# Patient Record
Sex: Female | Born: 1955 | Race: White | Hispanic: No | Marital: Married | Smoking: Current every day smoker
Health system: Southern US, Community
[De-identification: ages and names within clinical notes are randomized; demographics above are authoritative.]

## PROBLEM LIST (undated history)

## (undated) DIAGNOSIS — R519 Headache, unspecified: Secondary | ICD-10-CM

## (undated) DIAGNOSIS — F329 Major depressive disorder, single episode, unspecified: Secondary | ICD-10-CM

## (undated) DIAGNOSIS — K219 Gastro-esophageal reflux disease without esophagitis: Secondary | ICD-10-CM

## (undated) DIAGNOSIS — F32A Depression, unspecified: Secondary | ICD-10-CM

## (undated) DIAGNOSIS — K589 Irritable bowel syndrome without diarrhea: Secondary | ICD-10-CM

## (undated) DIAGNOSIS — R609 Edema, unspecified: Secondary | ICD-10-CM

## (undated) DIAGNOSIS — F419 Anxiety disorder, unspecified: Secondary | ICD-10-CM

## (undated) DIAGNOSIS — I10 Essential (primary) hypertension: Secondary | ICD-10-CM

## (undated) DIAGNOSIS — R51 Headache: Secondary | ICD-10-CM

## (undated) HISTORY — DX: Headache, unspecified: R51.9

## (undated) HISTORY — PX: BRAIN SURGERY: SHX531

## (undated) HISTORY — DX: Major depressive disorder, single episode, unspecified: F32.9

## (undated) HISTORY — PX: TONSILLECTOMY: SUR1361

## (undated) HISTORY — DX: Irritable bowel syndrome without diarrhea: K58.9

## (undated) HISTORY — DX: Depression, unspecified: F32.A

## (undated) HISTORY — DX: Anxiety disorder, unspecified: F41.9

## (undated) HISTORY — DX: Gastro-esophageal reflux disease without esophagitis: K21.9

## (undated) HISTORY — DX: Irritable bowel syndrome, unspecified: K58.9

## (undated) HISTORY — PX: GALLBLADDER SURGERY: SHX652

## (undated) HISTORY — DX: Headache: R51

## (undated) HISTORY — DX: Edema, unspecified: R60.9

---

## 1998-06-15 HISTORY — PX: OTHER SURGICAL HISTORY: SHX169

## 2002-06-15 HISTORY — PX: ARTERIAL ANEURYSM REPAIR: SHX556

## 2004-02-29 ENCOUNTER — Other Ambulatory Visit: Payer: Self-pay

## 2004-04-23 ENCOUNTER — Emergency Department: Payer: Self-pay | Admitting: Internal Medicine

## 2004-04-23 ENCOUNTER — Other Ambulatory Visit: Payer: Self-pay

## 2014-11-27 ENCOUNTER — Ambulatory Visit: Payer: Self-pay | Admitting: Family Medicine

## 2014-12-03 ENCOUNTER — Encounter: Payer: Self-pay | Admitting: Family Medicine

## 2014-12-03 ENCOUNTER — Ambulatory Visit (INDEPENDENT_AMBULATORY_CARE_PROVIDER_SITE_OTHER): Payer: Self-pay | Admitting: Family Medicine

## 2014-12-03 VITALS — BP 142/84 | HR 112 | Temp 98.5°F | Resp 16 | Ht 60.0 in | Wt 159.7 lb

## 2014-12-03 DIAGNOSIS — G44219 Episodic tension-type headache, not intractable: Secondary | ICD-10-CM

## 2014-12-03 DIAGNOSIS — R519 Headache, unspecified: Secondary | ICD-10-CM | POA: Insufficient documentation

## 2014-12-03 DIAGNOSIS — K589 Irritable bowel syndrome without diarrhea: Secondary | ICD-10-CM

## 2014-12-03 DIAGNOSIS — F411 Generalized anxiety disorder: Secondary | ICD-10-CM | POA: Insufficient documentation

## 2014-12-03 DIAGNOSIS — R51 Headache: Secondary | ICD-10-CM

## 2014-12-03 DIAGNOSIS — K2100 Gastro-esophageal reflux disease with esophagitis, without bleeding: Secondary | ICD-10-CM | POA: Insufficient documentation

## 2014-12-03 DIAGNOSIS — J449 Chronic obstructive pulmonary disease, unspecified: Secondary | ICD-10-CM

## 2014-12-03 DIAGNOSIS — G4700 Insomnia, unspecified: Secondary | ICD-10-CM | POA: Insufficient documentation

## 2014-12-03 DIAGNOSIS — D510 Vitamin B12 deficiency anemia due to intrinsic factor deficiency: Secondary | ICD-10-CM | POA: Insufficient documentation

## 2014-12-03 DIAGNOSIS — F32A Depression, unspecified: Secondary | ICD-10-CM

## 2014-12-03 DIAGNOSIS — R609 Edema, unspecified: Secondary | ICD-10-CM | POA: Insufficient documentation

## 2014-12-03 DIAGNOSIS — F329 Major depressive disorder, single episode, unspecified: Secondary | ICD-10-CM

## 2014-12-03 DIAGNOSIS — R300 Dysuria: Secondary | ICD-10-CM

## 2014-12-03 DIAGNOSIS — F17200 Nicotine dependence, unspecified, uncomplicated: Secondary | ICD-10-CM | POA: Insufficient documentation

## 2014-12-03 DIAGNOSIS — F1920 Other psychoactive substance dependence, uncomplicated: Secondary | ICD-10-CM | POA: Insufficient documentation

## 2014-12-03 DIAGNOSIS — R569 Unspecified convulsions: Secondary | ICD-10-CM

## 2014-12-03 LAB — POCT URINALYSIS DIPSTICK
Bilirubin, UA: NEGATIVE
Blood, UA: NEGATIVE
GLUCOSE UA: NEGATIVE
Leukocytes, UA: NEGATIVE
NITRITE UA: NEGATIVE
Protein, UA: NEGATIVE
Spec Grav, UA: 1.01
Urobilinogen, UA: NEGATIVE
pH, UA: 5

## 2014-12-03 MED ORDER — TIOTROPIUM BROMIDE MONOHYDRATE 18 MCG IN CAPS: 18.0000 ug | ORAL_CAPSULE | RESPIRATORY_TRACT | Status: DC

## 2014-12-03 MED ORDER — DIPHENOXYLATE-ATROPINE 2.5-0.025 MG PO TABS: 2.0000 | ORAL_TABLET | Freq: Four times a day (QID) | ORAL | Status: DC | PRN

## 2014-12-03 MED ORDER — DIPHENOXYLATE-ATROPINE 2.5-0.025 MG PO TABS: 1.0000 | ORAL_TABLET | Freq: Four times a day (QID) | ORAL | Status: DC | PRN
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
## 2014-12-03 NOTE — Patient Instructions (Signed)
FU in 3 mo

## 2015-01-08 ENCOUNTER — Encounter: Payer: Self-pay | Admitting: Family Medicine

## 2015-01-31 ENCOUNTER — Encounter: Payer: Self-pay | Admitting: Family Medicine

## 2015-02-27 ENCOUNTER — Other Ambulatory Visit: Payer: Self-pay | Admitting: Family Medicine

## 2015-02-27 NOTE — Telephone Encounter (Signed)
I am forwarding this encounter to the designated PCP and/or their nursing staff for further management of the tasks requested. Thank you.  

## 2015-02-28 ENCOUNTER — Encounter: Payer: Self-pay | Admitting: Family Medicine

## 2015-03-14 NOTE — Telephone Encounter (Signed)
Spoke to pharmacy. Patient must be seen

## 2015-05-30 ENCOUNTER — Encounter: Payer: Self-pay | Admitting: Family Medicine

## 2015-05-30 ENCOUNTER — Ambulatory Visit (INDEPENDENT_AMBULATORY_CARE_PROVIDER_SITE_OTHER): Payer: Self-pay | Admitting: Family Medicine

## 2015-05-30 VITALS — BP 134/78 | HR 110 | Temp 98.1°F | Resp 18 | Ht 60.0 in | Wt 167.4 lb

## 2015-05-30 DIAGNOSIS — R609 Edema, unspecified: Secondary | ICD-10-CM

## 2015-05-30 DIAGNOSIS — F329 Major depressive disorder, single episode, unspecified: Secondary | ICD-10-CM

## 2015-05-30 DIAGNOSIS — N3000 Acute cystitis without hematuria: Secondary | ICD-10-CM

## 2015-05-30 DIAGNOSIS — K2100 Gastro-esophageal reflux disease with esophagitis, without bleeding: Secondary | ICD-10-CM

## 2015-05-30 DIAGNOSIS — F331 Major depressive disorder, recurrent, moderate: Secondary | ICD-10-CM

## 2015-05-30 DIAGNOSIS — J449 Chronic obstructive pulmonary disease, unspecified: Secondary | ICD-10-CM

## 2015-05-30 DIAGNOSIS — K589 Irritable bowel syndrome without diarrhea: Secondary | ICD-10-CM

## 2015-05-30 DIAGNOSIS — Z23 Encounter for immunization: Secondary | ICD-10-CM

## 2015-05-30 DIAGNOSIS — F17200 Nicotine dependence, unspecified, uncomplicated: Secondary | ICD-10-CM

## 2015-05-30 DIAGNOSIS — F32A Depression, unspecified: Secondary | ICD-10-CM

## 2015-05-30 DIAGNOSIS — E669 Obesity, unspecified: Secondary | ICD-10-CM

## 2015-05-30 LAB — POCT URINALYSIS DIPSTICK
Bilirubin, UA: NEGATIVE
Glucose, UA: NEGATIVE
Leukocytes, UA: NEGATIVE
NITRITE UA: NEGATIVE
PH UA: 6
PROTEIN UA: NEGATIVE
RBC UA: NEGATIVE
SPEC GRAV UA: 1.015
UROBILINOGEN UA: NEGATIVE

## 2015-05-30 MED ORDER — TIOTROPIUM BROMIDE MONOHYDRATE 18 MCG IN CAPS: 18.0000 ug | ORAL_CAPSULE | Freq: Four times a day (QID) | RESPIRATORY_TRACT | Status: DC

## 2015-05-30 MED ORDER — CIPROFLOXACIN HCL 500 MG PO TABS: 500.0000 mg | ORAL_TABLET | Freq: Two times a day (BID) | ORAL | Status: DC

## 2015-05-30 MED ORDER — DIPHENOXYLATE-ATROPINE 2.5-0.025 MG PO TABS: 2.0000 | ORAL_TABLET | Freq: Four times a day (QID) | ORAL | Status: DC | PRN

## 2015-05-30 NOTE — Progress Notes (Signed)
Name: Megan Chung   MRN: [id number]    DOB: 08/24/1955   Date:05/30/2015       Progress Note  Subjective  Chief Complaint  Chief Complaint  Patient presents with  . Urinary Tract Infection  . Depression  . Insomnia    HPI  IBS with diarrhea predominance  Patient has a history of IBS for greater than 5 years. She takes Lomotil 4 times per day and this brings about enough controlled to make things functional for her. She has not had a colonoscopy not having insurance at this point but denies any blood in her stool time. There is been no recent weight loss in fact a 7 pound weight gain since last visit here.  Depression  Long-standing history of depression for which she currently patient is currently on Zoloft 100 mg daily. She has some decrease in mood at times but does have an endocrine spells suicidal ideations. Appetite is good and there is no significant sleep disturbance.  Hypertension   Patient presents for follow-up of hypertension. It has been present for over 5 years.  Patient states that there is compliance with medical regimen which consists of hydrochlorothiazide 25 mg daily . There is no end organ disease. Cardiac risk factors include hypertension hyperlipidemia and diabetes.  Exercise regimen consist of minimal walking .  Diet consist of salt restriction  COPD  History of COPD of for over 5 years. She continues to smoke despite warnings to the contrary. She is currently on Spiriva one elevation daily with when necessary rescue inhaler. There is no problem with any hemoptysis or purulent sputum production .  Past Medical History  Diagnosis Date  . Anxiety   . Headache   . IBS (irritable bowel syndrome)   . Depression   . Irritable bowel syndrome   . GERD (gastroesophageal reflux disease)   . Edema     Social History  Substance Use Topics  . Smoking status: Current Every Day Smoker -- 1.00 packs/day for 20 years    Types: Cigarettes  . Smokeless tobacco:  Never Used  . Alcohol Use: Not on file     Current outpatient prescriptions:  .  diphenoxylate-atropine (LOMOTIL) 2.5-0.025 MG per tablet, Take 2 tablets by mouth 4 (four) times daily as needed for diarrhea or loose stools., Disp: 240 tablet, Rfl: 2 .  hydrochlorothiazide (HYDRODIURIL) 25 MG tablet, Take by mouth., Disp: , Rfl:  .  omeprazole (PRILOSEC) 40 MG capsule, Take by mouth., Disp: , Rfl:  .  promethazine (PHENERGAN) 25 MG tablet, Take by mouth., Disp: , Rfl:  .  sertraline (ZOLOFT) 100 MG tablet, Take by mouth., Disp: , Rfl:  .  tiotropium (SPIRIVA HANDIHALER) 18 MCG inhalation capsule, Place 1 capsule (18 mcg total) into inhaler and inhale 1 day or 1 dose., Disp: 30 capsule, Rfl: 12 .  topiramate (TOPAMAX) 100 MG tablet, Take by mouth., Disp: , Rfl:  .  triamcinolone (KENALOG) 0.025 % cream, TRIAMCINOLONE ACETONIDE, 0.025% (External Cream)  apply to affected area Cream three times daily for 30 days  Quantity: 0;  Refills: 3   Ordered :18-Jul-2014  Dennison MascotMorrisey, [place] Droll MD;  Started 18-Jul-2014 Active, Disp: , Rfl:   Allergies  Allergen Reactions  . Penicillins Hives    Review of Systems  Constitutional: Positive for malaise/fatigue. Negative for fever, chills and weight loss.  HENT: Positive for congestion. Negative for hearing loss, sore throat and tinnitus.   Eyes: Negative for blurred vision, double vision and redness.  Respiratory: Positive  for cough and shortness of breath. Negative for hemoptysis and sputum production.   Cardiovascular: Negative for chest pain, palpitations, orthopnea, claudication and leg swelling.  Gastrointestinal: Positive for heartburn, nausea, abdominal pain and diarrhea. Negative for vomiting, constipation, blood in stool and melena.  Genitourinary: Negative for dysuria, urgency, frequency and hematuria.  Musculoskeletal: Negative for myalgias, back pain, joint pain, falls and neck pain.  Skin: Negative for itching.  Neurological: Positive for headaches.  Negative for dizziness, tingling, tremors, focal weakness, seizures, loss of consciousness and weakness.  Endo/Heme/Allergies: Does not bruise/bleed easily.  Psychiatric/Behavioral: Positive for depression. Negative for substance abuse. The patient is nervous/anxious. The patient does not have insomnia.      Objective  Filed Vitals:   05/30/15 1432  BP: 134/78  Pulse: 110  Temp: 98.1 F (36.7 C)  Resp: 18  Height: 5' (1.524 m)  Weight: 167 lb 6 oz (75.921 kg)  SpO2: 96%     Physical Exam  Constitutional: She is oriented to person, place, and time.  Obese  HENT:  Head: Normocephalic.  Eyes: EOM are normal. Pupils are equal, round, and reactive to light.  Neck: Normal range of motion. No thyromegaly present.  Cardiovascular: Normal rate, regular rhythm and normal heart sounds.   No murmur heard. Pulmonary/Chest: Effort normal.  Diminished breath sounds throughout with hyperresonance to percussion  Abdominal: Soft. Bowel sounds are normal. She exhibits no distension and no mass. There is tenderness (mild diffuse tenderness). There is no rebound and no guarding.  Musculoskeletal: Normal range of motion. She exhibits no edema.  Neurological: She is alert and oriented to person, place, and time. No cranial nerve deficit. Gait normal.  Skin: Skin is warm and dry. No rash noted.  Psychiatric: Memory normal.  Very anxious and loquacious as is her baseline      Assessment & Plan   1. IBS (irritable bowel syndrome) Severe stable - diphenoxylate-atropine (LOMOTIL) 2.5-0.025 MG tablet; Take 2 tablets by mouth 4 (four) times daily as needed for diarrhea or loose stools.  Dispense: 240 tablet; Refill: 2  2. COPD, moderate (HCC) Stable - tiotropium (SPIRIVA HANDIHALER) 18 MCG inhalation capsule; Place 1 capsule (18 mcg total) into inhaler and inhale 4 (four) times daily.  Dispense: 120 capsule; Refill: 2  3. Esophagitis, reflux Stable  4. Moderate episode of recurrent major  depressive disorder (HCC) Stable  5. Compulsive tobacco user syndrome Again encouraged discontinuation that she has reduced  6. Clinical depression Stable  7. Accumulation of fluid in tissues Resume diuretic  8. Obesity Encourage diet and exercise  9. Acute cystitis without hematuria Creeden. Liver Cipro - POCT Urinalysis Dipstick - ciprofloxacin (CIPRO) 500 MG tablet; Take 1 tablet (500 mg total) by mouth 2 (two) times daily.  Dispense: 14 tablet; Refill: 0  10. Need for influenza vaccination Given - Flu Vaccine QUAD 36+ mos PF IM (Fluarix & Fluzone Quad PF)

## 2015-07-26 ENCOUNTER — Other Ambulatory Visit: Payer: Self-pay | Admitting: Family Medicine

## 2015-08-21 ENCOUNTER — Ambulatory Visit (INDEPENDENT_AMBULATORY_CARE_PROVIDER_SITE_OTHER): Payer: Self-pay | Admitting: Family Medicine

## 2015-08-21 VITALS — BP 134/84 | HR 100 | Temp 98.0°F | Resp 14 | Ht 60.0 in | Wt 167.0 lb

## 2015-08-21 DIAGNOSIS — E538 Deficiency of other specified B group vitamins: Secondary | ICD-10-CM

## 2015-08-21 DIAGNOSIS — K589 Irritable bowel syndrome without diarrhea: Secondary | ICD-10-CM

## 2015-08-21 DIAGNOSIS — F32A Depression, unspecified: Secondary | ICD-10-CM

## 2015-08-21 DIAGNOSIS — K2100 Gastro-esophageal reflux disease with esophagitis, without bleeding: Secondary | ICD-10-CM

## 2015-08-21 DIAGNOSIS — R110 Nausea: Secondary | ICD-10-CM

## 2015-08-21 DIAGNOSIS — R569 Unspecified convulsions: Secondary | ICD-10-CM

## 2015-08-21 DIAGNOSIS — F329 Major depressive disorder, single episode, unspecified: Secondary | ICD-10-CM

## 2015-08-21 DIAGNOSIS — Z862 Personal history of diseases of the blood and blood-forming organs and certain disorders involving the immune mechanism: Secondary | ICD-10-CM | POA: Insufficient documentation

## 2015-08-21 DIAGNOSIS — I10 Essential (primary) hypertension: Secondary | ICD-10-CM

## 2015-08-21 MED ORDER — DIPHENOXYLATE-ATROPINE 2.5-0.025 MG PO TABS: 2.0000 | ORAL_TABLET | Freq: Four times a day (QID) | ORAL | Status: DC | PRN

## 2015-08-21 MED ORDER — TOPIRAMATE 100 MG PO TABS: 150.0000 mg | ORAL_TABLET | Freq: Two times a day (BID) | ORAL | Status: DC

## 2015-08-21 MED ORDER — HYDROCHLOROTHIAZIDE 25 MG PO TABS: 25.0000 mg | ORAL_TABLET | Freq: Every day | ORAL | Status: DC

## 2015-08-21 MED ORDER — PROMETHAZINE HCL 25 MG PO TABS: 25.0000 mg | ORAL_TABLET | Freq: Three times a day (TID) | ORAL | Status: DC | PRN

## 2015-08-21 MED ORDER — SERTRALINE HCL 100 MG PO TABS: 100.0000 mg | ORAL_TABLET | Freq: Two times a day (BID) | ORAL | Status: DC

## 2015-08-21 MED ORDER — OMEPRAZOLE 40 MG PO CPDR: 40.0000 mg | DELAYED_RELEASE_CAPSULE | Freq: Every day | ORAL | Status: DC

## 2015-08-21 NOTE — Progress Notes (Signed)
Name: Megan Chung   MRN: [id number]    DOB: 1955/10/13   Date:08/21/2015       Progress Note  Subjective  Chief Complaint  Chief Complaint  Patient presents with  . Medication Refill  . Hypertension  . Migraine  . Depression  . Gastroesophageal Reflux  . Irritable Bowel Syndrome    HPI  Megan Chung is a 60 year old female patient of Dr. Reece Leader Morrisey's who is here for general follow up of chronic conditions and refills. Patient has a history of IBS for many years. She takes Lomotil 4 times per day and this brings about enough controlled to make things functional for her. She has not had a colonoscopy due to not having health insurance at this point but denies any blood in her stool or unexplained weight loss at this time. Long-standing history of depression for which she currently patient is currently on Zoloft 100 mg twice daily. Symptoms can consist of depressed mood, difficulty concentrating, fatigue, impaired memory and insomnia. Onset was approximately several years ago, stable since that time.  She denies current suicidal and homicidal plan or intent.   Family history significant for anxiety and depression. Possible organic causes contributing are: endocrine/metabolic.  Risk factors: previous episode of depression.  Previous treatment includes Zoloft and individual therapy. She complains of the following side effects from the treatment: none. Patient presents for follow-up of hypertension. It has been present for over 5 years. Patient states that there is compliance with medical regimen which consists of hydrochlorothiazide 25 mg daily . There is no end organ disease although she has not be able afford recommended lab work. Cardiac risk factors include hypertension hyperlipidemia.  Her exercise regimen consist of minimal walking . Diet consist of salt restriction. GERD well controled on PPI. Seizure disorder well controled on Topiramate.   She also says she has pernicious anemia  and needs b12 shots Rx but it is not in her chart. Looked at [employer] EMR and she last had cobalamine prescribed in 03/2014.   Past Medical History  Diagnosis Date  . Anxiety   . Headache   . IBS (irritable bowel syndrome)   . Depression   . Irritable bowel syndrome   . GERD (gastroesophageal reflux disease)   . Edema     Patient Active Problem List   Diagnosis Date Noted  . Affective disorder, major (HCC) 12/03/2014  . Anxiety, generalized 12/03/2014  . COPD, moderate (HCC) 12/03/2014  . Clinical depression 12/03/2014  . Accumulation of fluid in tissues 12/03/2014  . Cephalalgia 12/03/2014  . Adaptive colitis 12/03/2014  . Cannot sleep 12/03/2014  . Addiction to drug (HCC) 12/03/2014  . 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 Esophagitis, reflux 12/03/2014  . Seizure (HCC) 12/03/2014  . Compulsive tobacco user syndrome 12/03/2014  . Dysuria 12/03/2014  . IBS (irritable bowel syndrome) 12/03/2014    Social History  Substance Use Topics  . Smoking status: Current Every Day Smoker -- 1.00 packs/day for 20 years    Types: Cigarettes  . Smokeless tobacco: Never Used  . Alcohol Use: Not on file     Current outpatient prescriptions:  .  diphenoxylate-atropine (LOMOTIL) 2.5-0.025 MG tablet, TAKE TWO TABLETS BY MOUTH FOUR TIMES DAILY AS NEEDED FOR DIARRHEA OR LOOSE STOOLS, Disp: 240 tablet, Rfl: 0 .  hydrochlorothiazide (HYDRODIURIL) 25 MG tablet, Take by mouth., Disp: , Rfl:  .  omeprazole (PRILOSEC) 40 MG capsule, Take by mouth., Disp: , Rfl:  .  promethazine (PHENERGAN) 25 MG tablet,  TAKE 1 TABLET EVERY 6 TO 8 HOURS, Disp: 100 tablet, Rfl: 1 .  sertraline (ZOLOFT) 100 MG tablet, Take by mouth., Disp: , Rfl:  .  topiramate (TOPAMAX) 100 MG tablet, 1 AND 1/2 TABLETS BY MOUTH TWICE DAILY, Disp: 60 tablet, Rfl: 5 .  triamcinolone (KENALOG) 0.025 % cream, TRIAMCINOLONE ACETONIDE, 0.025% (External Cream)  apply to affected area Cream three times daily for 30 days  Quantity: 0;  Refills: 3    Ordered :18-Jul-2014  Dennison Mascot MD;  Started 18-Jul-2014 Active, Disp: , Rfl:   Past Surgical History  Procedure Laterality Date  . Arterial aneurysm repair  2004    unspecified type  . Ulcer surgery  2000  . Brain surgery    . Gallbladder surgery    . Tonsillectomy      Family History  Problem Relation Age of Onset  . Depression Mother     Allergies  Allergen Reactions  . Penicillins Hives     Review of Systems  CONSTITUTIONAL: No significant weight changes, fever, chills, weakness or fatigue.  HEENT:  - Eyes: No visual changes.  - Ears: No auditory changes. No pain.  - Nose: No sneezing, congestion, runny nose. - Throat: No sore throat. No changes in swallowing. SKIN: No rash or itching.  CARDIOVASCULAR: No chest pain, chest pressure or chest discomfort. No palpitations or edema.  RESPIRATORY: No shortness of breath, cough or sputum.  GASTROINTESTINAL: No anorexia, nausea, vomiting. No changes in bowel habits. No abdominal pain or blood.  GENITOURINARY: No dysuria. No frequency. No discharge.  NEUROLOGICAL: No headache, dizziness, syncope, paralysis, ataxia, numbness or tingling in the extremities. No memory changes. No change in bowel or bladder control.  MUSCULOSKELETAL: No joint pain. No muscle pain. HEMATOLOGIC: No anemia, bleeding or bruising.  LYMPHATICS: No enlarged lymph nodes.  PSYCHIATRIC: No change in mood. No change in sleep pattern.  ENDOCRINOLOGIC: No reports of sweating, cold or heat intolerance. No polyuria or polydipsia.     Objective  BP 134/84 mmHg  Pulse 100  Temp(Src) 98 F (36.7 C) (Oral)  Resp 14  Ht 5' (1.524 m)  Wt 167 lb (75.751 kg)  BMI 32.62 kg/m2  SpO2 95% Body mass index is 32.62 kg/(m^2).  Physical Exam  Constitutional: Patient remains obese and well-nourished. In no distress.  Neck: Normal range of motion. Neck supple. No JVD present. No thyromegaly present.  Cardiovascular: Normal rate, regular rhythm and normal  heart sounds.  No murmur heard.  Pulmonary/Chest: Effort normal and breath sounds normal. No respiratory distress. Musculoskeletal: Normal range of motion bilateral UE and LE, no joint effusions. Peripheral vascular: Bilateral LE no edema. Neurological: CN II-XII grossly intact with no focal deficits. Alert and oriented to person, place, and time. Coordination, balance, strength, speech and gait are normal.  Skin: Skin is warm and dry. No rash noted. No erythema.  Psychiatric: Patient has a stable mood and affect. Behavior is normal in office today. Judgment and thought content normal in office today.   Recent Results (from the past 2160 hour(s))  POCT Urinalysis Dipstick     Status: None   Collection Time: 05/30/15  2:47 PM  Result Value Ref Range   Color, UA yellow    Clarity, UA clear    Glucose, UA neg    Bilirubin, UA neg    Ketones, UA trace    Spec Grav, UA 1.015    Blood, UA neg    pH, UA 6.0    Protein, UA neg  Urobilinogen, UA negative    Nitrite, UA neg    Leukocytes, UA Negative Negative     Assessment & Plan  1. IBS (irritable bowel syndrome) Stable.  - diphenoxylate-atropine (LOMOTIL) 2.5-0.025 MG tablet; Take 2 tablets by mouth 4 (four) times daily as needed for diarrhea or loose stools.  Dispense: 240 tablet; Refill: 3  2. Esophagitis, reflux Stable.  - omeprazole (PRILOSEC) 40 MG capsule; Take 1 capsule (40 mg total) by mouth daily.  Dispense: 30 capsule; Refill: 3  3. Clinical depression Stable.  - sertraline (ZOLOFT) 100 MG tablet; Take 1 tablet (100 mg total) by mouth 2 (two) times daily.  Dispense: 60 tablet; Refill: 3  4. Hypertension goal BP (blood pressure) < 140/90 Stable.  - hydrochlorothiazide (HYDRODIURIL) 25 MG tablet; Take 1 tablet (25 mg total) by mouth daily.  Dispense: 30 tablet; Refill: 3  5. Seizure (HCC) Stable.  - topiramate (TOPAMAX) 100 MG tablet; Take 1.5 tablets (150 mg total) by mouth 2 (two) times daily.  Dispense: 90  tablet; Refill: 3  6. Vitamin B12 deficiency I will not re start B12 until lab work identifies deficiency.   - B12  7. Chronic nausea Advised patient nausea medication only to be used PRN basis.  - promethazine (PHENERGAN) 25 MG tablet; Take 1 tablet (25 mg total) by mouth every 8 (eight) hours as needed for nausea or vomiting.  Dispense: 30 tablet; Refill: 2

## 2015-08-22 ENCOUNTER — Encounter: Payer: Self-pay | Admitting: Family Medicine

## 2015-09-02 ENCOUNTER — Ambulatory Visit: Payer: Self-pay | Admitting: Family Medicine

## 2015-11-27 ENCOUNTER — Ambulatory Visit: Payer: Self-pay | Admitting: Family Medicine

## 2015-12-19 ENCOUNTER — Encounter: Payer: Self-pay | Admitting: Family Medicine

## 2015-12-19 ENCOUNTER — Ambulatory Visit (INDEPENDENT_AMBULATORY_CARE_PROVIDER_SITE_OTHER): Payer: Self-pay | Admitting: Family Medicine

## 2015-12-19 VITALS — BP 136/80 | HR 102 | Temp 98.1°F | Resp 15 | Ht 60.0 in | Wt 155.3 lb

## 2015-12-19 DIAGNOSIS — I10 Essential (primary) hypertension: Secondary | ICD-10-CM

## 2015-12-19 DIAGNOSIS — R51 Headache: Secondary | ICD-10-CM

## 2015-12-19 DIAGNOSIS — K589 Irritable bowel syndrome without diarrhea: Secondary | ICD-10-CM

## 2015-12-19 DIAGNOSIS — R519 Headache, unspecified: Secondary | ICD-10-CM | POA: Insufficient documentation

## 2015-12-19 MED ORDER — HYDROCHLOROTHIAZIDE 25 MG PO TABS: 25.0000 mg | ORAL_TABLET | Freq: Every day | ORAL | Status: DC

## 2015-12-19 MED ORDER — CLONAZEPAM 1 MG PO TABS: 1.0000 mg | ORAL_TABLET | Freq: Two times a day (BID) | ORAL | Status: DC | PRN

## 2015-12-19 MED ORDER — DIPHENOXYLATE-ATROPINE 2.5-0.025 MG PO TABS: 2.0000 | ORAL_TABLET | Freq: Four times a day (QID) | ORAL | Status: DC | PRN

## 2015-12-19 NOTE — Progress Notes (Signed)
Name: Eliott NineKaren D Deane   MRN: [id number]    DOB: 04/28/1956   Date:12/19/2015       Progress Note  Subjective  Chief Complaint  Chief Complaint  Patient presents with  . Medication Refill  This patient is usually followed by Dr. Thana AtesMorrisey, new to me.  Hypertension This is a chronic problem. The problem is controlled. Associated symptoms include headaches (chronic daily headaches after surgery) and shortness of breath. Pertinent negatives include no blurred vision, chest pain or palpitations. Past treatments include diuretics. There is no history of kidney disease, CAD/MI or CVA.    Irritable Bowel Syndrome:  Pt. Presents for follow up of IBS-Diarrhea predominant. She has had IBS ever since her surgery for removal of gallbladder in 1979, after surgery, her abdominal pain got worse and was eventually diagnosed with IBS. Has experienced accidents in public and does not eat out for fear of triggering an episode. She takes Lomotil which makes her relaxed and decreased the incidence and frequency of diarrhea.  Headache: Chronic daily headaches, started after she had surgery for two brain aneurysms in 2004. Pain is rated at 4/10, she takes Clonazepam 1 mg twice daily as needed for the headache and also to help her sleep. Headache worse with activity, stress (recently involved in separation proceedings with her husband). Sometimes wakes up in the morning having a headache.    Past Medical History  Diagnosis Date  . Anxiety   . Headache   . IBS (irritable bowel syndrome)   . Depression   . Irritable bowel syndrome   . GERD (gastroesophageal reflux disease)   . Edema     Past Surgical History  Procedure Laterality Date  . Arterial aneurysm repair  2004    unspecified type  . Ulcer surgery  2000  . Brain surgery    . Gallbladder surgery    . Tonsillectomy      Family History  Problem Relation Age of Onset  . Depression Mother     Social History   Social History  . Marital  Status: Married    Spouse Name: N/A  . Number of Children: N/A  . Years of Education: N/A   Occupational History  . Not on file.   Social History Main Topics  . Smoking status: Current Every Day Smoker -- 1.00 packs/day for 20 years    Types: Cigarettes  . Smokeless tobacco: Never Used  . Alcohol Use: Not on file  . Drug Use: Not on file  . Sexual Activity:    Partners: Male   Other Topics Concern  . Not on file   Social History Narrative     Current outpatient prescriptions:  .  albuterol (PROAIR HFA) 108 (90 Base) MCG/ACT inhaler, Inhale into the lungs., Disp: , Rfl:  .  aspirin EC 81 MG tablet, Take by mouth., Disp: , Rfl:  .  clonazePAM (KLONOPIN) 1 MG tablet, Take by mouth., Disp: , Rfl:  .  diphenoxylate-atropine (LOMOTIL) 2.5-0.025 MG tablet, Take 2 tablets by mouth 4 (four) times daily as needed for diarrhea or loose stools., Disp: 240 tablet, Rfl: 3 .  hydrochlorothiazide (HYDRODIURIL) 25 MG tablet, Take 1 tablet (25 mg total) by mouth daily., Disp: 30 tablet, Rfl: 3 .  omeprazole (PRILOSEC) 40 MG capsule, Take 1 capsule (40 mg total) by mouth daily., Disp: 30 capsule, Rfl: 3 .  promethazine (PHENERGAN) 25 MG tablet, Take 1 tablet (25 mg total) by mouth every 8 (eight) hours as needed for nausea or vomiting.,  Disp: 30 tablet, Rfl: 2 .  sertraline (ZOLOFT) 100 MG tablet, Take 1 tablet (100 mg total) by mouth 2 (two) times daily., Disp: 60 tablet, Rfl: 3 .  topiramate (TOPAMAX) 100 MG tablet, Take 1.5 tablets (150 mg total) by mouth 2 (two) times daily., Disp: 90 tablet, Rfl: 3 .  triamcinolone (KENALOG) 0.025 % cream, TRIAMCINOLONE ACETONIDE, 0.025% (External Cream)  apply to affected area Cream three times daily for 30 days  Quantity: 0;  Refills: 3   Ordered :18-Jul-2014  Dennison MascotMorrisey, Lemont MD;  Started 18-Jul-2014 Active, Disp: , Rfl:   Allergies  Allergen Reactions  . Penicillins Hives    Review of Systems  Eyes: Negative for blurred vision.  Respiratory: Positive  for shortness of breath.   Cardiovascular: Negative for chest pain and palpitations.  Neurological: Positive for headaches (chronic daily headaches after surgery).     Objective  Filed Vitals:   12/19/15 1515  BP: 136/80  Pulse: 102  Temp: 98.1 F (36.7 C)  TempSrc: Oral  Resp: 15  Height: 5' (1.524 m)  Weight: 155 lb 4.8 oz (70.444 kg)  SpO2: 96%    Physical Exam  Constitutional: She is oriented to person, place, and time and well-developed, well-nourished, and in no distress.  Cardiovascular: Normal rate and regular rhythm.   No murmur heard. Pulmonary/Chest: Effort normal and breath sounds normal. She has no wheezes. She has no rales.  Abdominal: Soft. Bowel sounds are normal.  Musculoskeletal:       Right ankle: She exhibits no swelling.       Left ankle: She exhibits no swelling.  Neurological: She is alert and oriented to person, place, and time.  Psychiatric: Mood, memory, affect and judgment normal.  Nursing note and vitals reviewed.      Assessment & Plan  1. IBS (irritable bowel syndrome) Refills for Lomotil provided. Follow-up in one month - diphenoxylate-atropine (LOMOTIL) 2.5-0.025 MG tablet; Take 2 tablets by mouth 4 (four) times daily as needed for diarrhea or loose stools.  Dispense: 240 tablet; Refill: 0  2. Chronic nonintractable headache, unspecified headache type Headaches after surgery for cerebral aneurysms. Continue on clonazepam as prescribed. - clonazePAM (KLONOPIN) 1 MG tablet; Take 1 tablet (1 mg total) by mouth 2 (two) times daily as needed for anxiety.  Dispense: 60 tablet; Refill: 0  3. Hypertension goal BP (blood pressure) < 140/90 BP at goal and controlled on present antihypertensive therapy - hydrochlorothiazide (HYDRODIURIL) 25 MG tablet; Take 1 tablet (25 mg total) by mouth daily.  Dispense: 90 tablet; Refill: 0   Sheritta Deeg Asad A. [name] [facility] 12/19/2015 3:27 PM

## 2016-01-22 ENCOUNTER — Encounter: Payer: Self-pay | Admitting: Family Medicine

## 2016-01-22 ENCOUNTER — Ambulatory Visit (INDEPENDENT_AMBULATORY_CARE_PROVIDER_SITE_OTHER): Payer: Self-pay | Admitting: Family Medicine

## 2016-01-22 DIAGNOSIS — K2100 Gastro-esophageal reflux disease with esophagitis, without bleeding: Secondary | ICD-10-CM

## 2016-01-22 DIAGNOSIS — F329 Major depressive disorder, single episode, unspecified: Secondary | ICD-10-CM

## 2016-01-22 DIAGNOSIS — G8929 Other chronic pain: Secondary | ICD-10-CM

## 2016-01-22 DIAGNOSIS — I10 Essential (primary) hypertension: Secondary | ICD-10-CM

## 2016-01-22 DIAGNOSIS — F32A Depression, unspecified: Secondary | ICD-10-CM

## 2016-01-22 DIAGNOSIS — K589 Irritable bowel syndrome without diarrhea: Secondary | ICD-10-CM

## 2016-01-22 DIAGNOSIS — R51 Headache: Secondary | ICD-10-CM

## 2016-01-22 MED ORDER — CLONAZEPAM 1 MG PO TABS: 1.0000 mg | ORAL_TABLET | Freq: Two times a day (BID) | ORAL | 2 refills | Status: DC | PRN

## 2016-01-22 MED ORDER — SERTRALINE HCL 100 MG PO TABS: 100.0000 mg | ORAL_TABLET | Freq: Two times a day (BID) | ORAL | 0 refills | Status: AC

## 2016-01-22 MED ORDER — PROMETHAZINE HCL 25 MG PO TABS: 25.0000 mg | ORAL_TABLET | Freq: Three times a day (TID) | ORAL | 0 refills | Status: DC | PRN

## 2016-01-22 MED ORDER — DIPHENOXYLATE-ATROPINE 2.5-0.025 MG PO TABS: 2.0000 | ORAL_TABLET | Freq: Four times a day (QID) | ORAL | 2 refills | Status: DC | PRN

## 2016-01-22 MED ORDER — OMEPRAZOLE 40 MG PO CPDR: 40.0000 mg | DELAYED_RELEASE_CAPSULE | Freq: Every day | ORAL | 0 refills | Status: AC

## 2016-01-22 MED ORDER — HYDROCHLOROTHIAZIDE 25 MG PO TABS: 25.0000 mg | ORAL_TABLET | Freq: Every day | ORAL | 0 refills | Status: AC

## 2016-01-22 MED ORDER — TOPIRAMATE 100 MG PO TABS: 150.0000 mg | ORAL_TABLET | Freq: Two times a day (BID) | ORAL | 0 refills | Status: AC

## 2016-01-22 NOTE — Progress Notes (Signed)
Name: Megan Chung   MRN: [id number]    DOB: 1955-09-29   Date:01/22/2016       Progress Note  Subjective  Chief Complaint  Chief Complaint  Patient presents with  . Follow-up    1 mo  . Medication Refill     Associated symptoms include fatigue and headaches. Pertinent negatives include no abdominal pain, chest pain, chills or fever.  Anxiety  Presents for follow-up visit. Symptoms include depressed mood, insomnia, nervous/anxious behavior, panic and shortness of breath. Patient reports no chest pain.    Depression         This is a chronic problem.  Associated symptoms include fatigue, insomnia and headaches.  Past treatments include SSRIs - Selective serotonin reuptake inhibitors.  Past medical history includes anxiety.   Hypertension  This is a chronic problem. The problem is unchanged. Associated symptoms include anxiety, headaches and shortness of breath. Pertinent negatives include no chest pain. Past treatments include diuretics. There is no history of kidney disease, CAD/MI or CVA.    Past Medical History:  Diagnosis Date  . Anxiety   . Depression   . Edema   . GERD (gastroesophageal reflux disease)   . Headache   . IBS (irritable bowel syndrome)   . Irritable bowel syndrome     Past Surgical History:  Procedure Laterality Date  . ARTERIAL ANEURYSM REPAIR  2004   unspecified type  . BRAIN SURGERY    . GALLBLADDER SURGERY    . TONSILLECTOMY    . ulcer surgery  2000    Family History  Problem Relation Age of Onset  . Depression Mother     Social History   Social History  . Marital status: Married    Spouse name: N/A  . Number of children: N/A  . Years of education: N/A   Occupational History  . Not on file.   Social History Main Topics  . Smoking status: Current Every Day Smoker    Packs/day: 1.00    Years: 20.00    Types: Cigarettes  . Smokeless tobacco: Never Used  . Alcohol use No  . Drug use: No  . Sexual activity: Yes    Partners:  Male   Other Topics Concern  . Not on file   Social History Narrative  . No narrative on file     Current Outpatient Prescriptions:  .  albuterol (PROAIR HFA) 108 (90 Base) MCG/ACT inhaler, Inhale into the lungs., Disp: , Rfl:  .  aspirin EC 81 MG tablet, Take by mouth., Disp: , Rfl:  .  clonazePAM (KLONOPIN) 1 MG tablet, Take 1 tablet (1 mg total) by mouth 2 (two) times daily as needed for anxiety., Disp: 60 tablet, Rfl: 0 .  diphenoxylate-atropine (LOMOTIL) 2.5-0.025 MG tablet, Take 2 tablets by mouth 4 (four) times daily as needed for diarrhea or loose stools., Disp: 240 tablet, Rfl: 0 .  hydrochlorothiazide (HYDRODIURIL) 25 MG tablet, Take 1 tablet (25 mg total) by mouth daily., Disp: 90 tablet, Rfl: 0 .  omeprazole (PRILOSEC) 40 MG capsule, Take 1 capsule (40 mg total) by mouth daily., Disp: 30 capsule, Rfl: 3 .  promethazine (PHENERGAN) 25 MG tablet, Take 1 tablet (25 mg total) by mouth every 8 (eight) hours as needed for nausea or vomiting., Disp: 30 tablet, Rfl: 2 .  sertraline (ZOLOFT) 100 MG tablet, Take 1 tablet (100 mg total) by mouth 2 (two) times daily., Disp: 60 tablet, Rfl: 3 .  topiramate (TOPAMAX) 100 MG tablet, Take 1.5  tablets (150 mg total) by mouth 2 (two) times daily., Disp: 90 tablet, Rfl: 3 .  triamcinolone (KENALOG) 0.025 % cream, TRIAMCINOLONE ACETONIDE, 0.025% (External Cream)  apply to affected area Cream three times daily for 30 days  Quantity: 0;  Refills: 3   Ordered :18-Jul-2014  Dennison MascotMorrisey, Lemont MD;  Started 18-Jul-2014 Active, Disp: , Rfl:   Allergies  Allergen Reactions  . Penicillins Hives     Review of Systems  Constitutional: Positive for fatigue. Negative for chills and fever.  Respiratory: Positive for shortness of breath.   Cardiovascular: Negative for chest pain.  Gastrointestinal: Negative for abdominal pain.  Neurological: Positive for headaches.  Psychiatric/Behavioral: Positive for depression. The patient is nervous/anxious and has  insomnia.     Objective  Vitals:   01/22/16 1519  BP: 138/76  Pulse: (!) 117  Resp: 17  Temp: 98.9 F (37.2 C)  TempSrc: Oral  SpO2: 95%  Weight: 159 lb 1.6 oz (72.2 kg)  Height: 5' (1.524 m)    Physical Exam  Constitutional: She is oriented to person, place, and time and well-developed, well-nourished, and in no distress.  HENT:  Head: Normocephalic and atraumatic.  Cardiovascular: Normal rate, regular rhythm and normal heart sounds.   No murmur heard. Pulmonary/Chest: Effort normal and breath sounds normal. She has no wheezes.  Abdominal: Soft. Bowel sounds are normal.  Neurological: She is alert and oriented to person, place, and time.  Psychiatric: Mood, memory, affect and judgment normal.  Nursing note and vitals reviewed.    Assessment & Plan  1. Chronic nonintractable headache, unspecified headache type  - promethazine (PHENERGAN) 25 MG tablet; Take 1 tablet (25 mg total) by mouth every 8 (eight) hours as needed for nausea or vomiting.  Dispense: 90 tablet; Refill: 0 - clonazePAM (KLONOPIN) 1 MG tablet; Take 1 tablet (1 mg total) by mouth 2 (two) times daily as needed for anxiety.  Dispense: 60 tablet; Refill: 2 - topiramate (TOPAMAX) 100 MG tablet; Take 1.5 tablets (150 mg total) by mouth 2 (two) times daily.  Dispense: 90 tablet; Refill: 0  2. Clinical depression - sertraline (ZOLOFT) 100 MG tablet; Take 1 tablet (100 mg total) by mouth 2 (two) times daily.  Dispense: 180 tablet; Refill: 0  3. Esophagitis, reflux  - omeprazole (PRILOSEC) 40 MG capsule; Take 1 capsule (40 mg total) by mouth daily.  Dispense: 90 capsule; Refill: 0  4. Hypertension goal BP (blood pressure) < 140/90  - hydrochlorothiazide (HYDRODIURIL) 25 MG tablet; Take 1 tablet (25 mg total) by mouth daily.  Dispense: 90 tablet; Refill: 0  5. IBS (irritable bowel syndrome)  - diphenoxylate-atropine (LOMOTIL) 2.5-0.025 MG tablet; Take 2 tablets by mouth 4 (four) times daily as needed for  diarrhea or loose stools.  Dispense: 240 tablet; Refill: 2      [name] A. [name] [facility] 01/22/2016 3:33 PM

## 2016-03-25 ENCOUNTER — Other Ambulatory Visit: Payer: Self-pay | Admitting: Family Medicine

## 2016-03-25 DIAGNOSIS — R519 Headache, unspecified: Secondary | ICD-10-CM

## 2016-03-25 DIAGNOSIS — R51 Headache: Principal | ICD-10-CM

## 2016-04-06 ENCOUNTER — Ambulatory Visit: Payer: Self-pay | Admitting: Family Medicine

## 2016-04-09 ENCOUNTER — Ambulatory Visit (INDEPENDENT_AMBULATORY_CARE_PROVIDER_SITE_OTHER): Payer: Self-pay | Admitting: Family Medicine

## 2016-04-09 ENCOUNTER — Encounter: Payer: Self-pay | Admitting: Family Medicine

## 2016-04-09 VITALS — BP 120/70 | HR 82 | Temp 98.3°F | Resp 16 | Ht 60.0 in | Wt 144.7 lb

## 2016-04-09 DIAGNOSIS — Z862 Personal history of diseases of the blood and blood-forming organs and certain disorders involving the immune mechanism: Secondary | ICD-10-CM

## 2016-04-09 DIAGNOSIS — K580 Irritable bowel syndrome with diarrhea: Secondary | ICD-10-CM

## 2016-04-09 LAB — CBC WITH DIFFERENTIAL/PLATELET
BASOS ABS: 0 {cells}/uL (ref 0–200)
Basophils Relative: 0 %
Eosinophils Absolute: 222 cells/uL (ref 15–500)
Eosinophils Relative: 2 %
HEMATOCRIT: 31.6 % — AB (ref 35.0–45.0)
HEMOGLOBIN: 9.6 g/dL — AB (ref 11.7–15.5)
LYMPHS ABS: 2331 {cells}/uL (ref 850–3900)
Lymphocytes Relative: 21 %
MCH: 20.2 pg — AB (ref 27.0–33.0)
MCHC: 30.4 g/dL — AB (ref 32.0–36.0)
MCV: 66.4 fL — AB (ref 80.0–100.0)
MONO ABS: 777 {cells}/uL (ref 200–950)
MPV: 9.5 fL (ref 7.5–12.5)
Monocytes Relative: 7 %
NEUTROS PCT: 70 %
Neutro Abs: 7770 cells/uL (ref 1500–7800)
Platelets: 333 10*3/uL (ref 140–400)
RBC: 4.76 MIL/uL (ref 3.80–5.10)
RDW: 19 % — ABNORMAL HIGH (ref 11.0–15.0)
WBC: 11.1 10*3/uL — ABNORMAL HIGH (ref 3.8–10.8)

## 2016-04-09 LAB — VITAMIN B12: Vitamin B-12: 500 pg/mL (ref 200–1100)

## 2016-04-09 MED ORDER — DIPHENOXYLATE-ATROPINE 2.5-0.025 MG PO TABS: 2.0000 | ORAL_TABLET | Freq: Four times a day (QID) | ORAL | 2 refills | Status: DC | PRN

## 2016-04-09 NOTE — Progress Notes (Signed)
Name: Megan Chung   MRN: [id number]    DOB: 11-21-1955   Date:04/09/2016       Progress Note  Subjective  Chief Complaint  Chief Complaint  Patient presents with  . Follow-up    medication refills    HPI  History of Pernicious Anemia: Pt. Reportedly has history of pernicious anemia diagnosed over 20 years ago when she got really sick and had to be admitted to [facility]. She was discharged with monthly B12 injections for Vitamin B 12 deficiency. She reports that ever since then, she has gotten B 12 injections every month. No recent B 12 level on file but patient reports that her husband found an old B 12 injection in her stack of medications and she has gotten the last two injections in the last two months and that now its time for another one.  She feels that right after the injection, she has more energy, feels well.   Past Medical History:  Diagnosis Date  . Anxiety   . Depression   . Edema   . GERD (gastroesophageal reflux disease)   . Headache   . IBS (irritable bowel syndrome)   . Irritable bowel syndrome     Past Surgical History:  Procedure Laterality Date  . ARTERIAL ANEURYSM REPAIR  2004   unspecified type  . BRAIN SURGERY    . GALLBLADDER SURGERY    . TONSILLECTOMY    . ulcer surgery  2000    Family History  Problem Relation Age of Onset  . Depression Mother     Social History   Social History  . Marital status: Married    Spouse name: N/A  . Number of children: N/A  . Years of education: N/A   Occupational History  . Not on file.   Social History Main Topics  . Smoking status: Current Every Day Smoker    Packs/day: 1.00    Years: 20.00    Types: Cigarettes  . Smokeless tobacco: Never Used  . Alcohol use No  . Drug use: No  . Sexual activity: Yes    Partners: Male   Other Topics Concern  . Not on file   Social History Narrative  . No narrative on file     Current Outpatient Prescriptions:  .  albuterol (PROAIR HFA) 108  (90 Base) MCG/ACT inhaler, Inhale into the lungs., Disp: , Rfl:  .  aspirin EC 81 MG tablet, Take by mouth., Disp: , Rfl:  .  clonazePAM (KLONOPIN) 1 MG tablet, Take 1 tablet (1 mg total) by mouth 2 (two) times daily as needed for anxiety., Disp: 60 tablet, Rfl: 2 .  diphenoxylate-atropine (LOMOTIL) 2.5-0.025 MG tablet, Take 2 tablets by mouth 4 (four) times daily as needed for diarrhea or loose stools., Disp: 240 tablet, Rfl: 2 .  hydrochlorothiazide (HYDRODIURIL) 25 MG tablet, Take 1 tablet (25 mg total) by mouth daily., Disp: 90 tablet, Rfl: 0 .  omeprazole (PRILOSEC) 40 MG capsule, Take 1 capsule (40 mg total) by mouth daily., Disp: 90 capsule, Rfl: 0 .  promethazine (PHENERGAN) 25 MG tablet, TAKE 1 TABLET BY MOUTH EVERY 8 HOURS AS NEEDED FOR NAUSEA & VOMITING, Disp: 90 tablet, Rfl: 1 .  sertraline (ZOLOFT) 100 MG tablet, Take 1 tablet (100 mg total) by mouth 2 (two) times daily., Disp: 180 tablet, Rfl: 0 .  topiramate (TOPAMAX) 100 MG tablet, Take 1.5 tablets (150 mg total) by mouth 2 (two) times daily., Disp: 90 tablet, Rfl: 0 .  triamcinolone (KENALOG) 0.025 % cream, TRIAMCINOLONE ACETONIDE, 0.025% (External Cream)  apply to affected area Cream three times daily for 30 days  Quantity: 0;  Refills: 3   Ordered :18-Jul-2014  Dennison MascotMorrisey, Lemont MD;  Started 18-Jul-2014 Active, Disp: , Rfl:   Allergies  Allergen Reactions  . Penicillins Hives     Review of Systems  Constitutional: Negative for chills, fever, malaise/fatigue and weight loss.  Gastrointestinal: Positive for diarrhea. Negative for abdominal pain, nausea and vomiting.  Neurological: Positive for headaches.     Objective  Vitals:   04/09/16 1434  BP: 120/70  Pulse: 82  Resp: 16  Temp: 98.3 F (36.8 C)  TempSrc: Oral  SpO2: 96%  Weight: 144 lb 11.2 oz (65.6 kg)  Height: 5' (1.524 m)    Physical Exam  Constitutional: She is oriented to person, place, and time and well-developed, well-nourished, and in no distress.   HENT:  Head: Normocephalic and atraumatic.  Cardiovascular: Normal rate, regular rhythm, S1 normal, S2 normal and normal heart sounds.   No murmur heard. Pulmonary/Chest: Effort normal and breath sounds normal. She has no decreased breath sounds. She has no wheezes.  Abdominal: Soft. Bowel sounds are normal. There is no tenderness.  Neurological: She is alert and oriented to person, place, and time.  Psychiatric: Mood, memory, affect and judgment normal.  Nursing note and vitals reviewed.      Assessment & Plan  1. History of pernicious anemia Obtain lab work including complete blood count, folic acid and B12 levels, PCP notes reviewed which indicates that patient has received vitamin B12 injections in the past. - CBC with Differential - RBC Folate - B12  2. Irritable bowel syndrome with diarrhea  - diphenoxylate-atropine (LOMOTIL) 2.5-0.025 MG tablet; Take 2 tablets by mouth 4 (four) times daily as needed for diarrhea or loose stools.  Dispense: 240 tablet; Refill: 2   Skarleth Delmonico Asad A. [name] [facility] 04/09/2016 2:52 PM

## 2016-04-10 LAB — FOLATE RBC: RBC Folate: 731 ng/mL (ref >280)

## 2016-04-22 ENCOUNTER — Telehealth: Payer: Self-pay | Admitting: Family Medicine

## 2016-04-22 NOTE — Telephone Encounter (Signed)
Sp[facility] with [facility] reports that patient has been doctor shopping and getting prescription for Lomotil from our practice and [facility] Greggory StallionGeorge [facility] [place]. She is getting 240 tablets each doctor based on the review of notes in the controlled substances registry database. She is getting them filled at 2 different pharmacies [facility] and [facility] according to [place]. I explained that we will make an entry in her chart and she will no longer be prescribed any Lomotil from our practice. She will be referred to gastroenterology for further management.

## 2016-04-22 NOTE — Telephone Encounter (Signed)
Sam from [facility] states patient is changing pharmacy. She is no longer using [facility]. She is asking that you please send all her medications to [facility] especially her B-12 and HCTZ.  (P) [phone]) [phone]

## 2016-04-22 NOTE — Telephone Encounter (Signed)
Patient scheduled appointment for 05/12/16

## 2016-04-22 NOTE — Telephone Encounter (Signed)
Brett CanalesSteve from [facility] states he checked the registery today and found out that patient is getting Lomotil from you and another doctor. When he asked her about it she decided to discontinue her services with them. Please return pharmacy call [phone]

## 2016-04-22 NOTE — Telephone Encounter (Signed)
Patient will need an office visit appointment before any more refills on any medication.

## 2016-05-12 ENCOUNTER — Ambulatory Visit: Payer: Self-pay | Admitting: Family Medicine

## 2016-05-27 ENCOUNTER — Ambulatory Visit: Payer: Self-pay | Admitting: Family Medicine

## 2016-05-28 ENCOUNTER — Telehealth: Payer: Self-pay | Admitting: Family Medicine

## 2016-05-28 ENCOUNTER — Ambulatory Visit: Payer: Self-pay | Admitting: Family Medicine

## 2016-05-28 NOTE — Telephone Encounter (Signed)
Pt had appointment for today but had to reschedule due to transportation issues. She rescheduled for 06/25/16. She is asking that you please refill topamax (seizure meds). She is asking that you give her 90 pills. She has changed pharmacy and is asking that you send to [facility]. Patient apologies for having to cancel today

## 2016-05-28 NOTE — Telephone Encounter (Signed)
This patient need to be seen for any other refills

## 2016-05-29 NOTE — Telephone Encounter (Signed)
Patient verbally informed °

## 2016-06-25 ENCOUNTER — Other Ambulatory Visit: Payer: Self-pay | Admitting: Emergency Medicine

## 2016-06-25 ENCOUNTER — Encounter: Payer: Self-pay | Admitting: Family Medicine

## 2016-06-25 ENCOUNTER — Ambulatory Visit: Payer: Self-pay | Admitting: Family Medicine

## 2016-06-25 DIAGNOSIS — K580 Irritable bowel syndrome with diarrhea: Secondary | ICD-10-CM

## 2016-06-25 MED ORDER — DIPHENOXYLATE-ATROPINE 2.5-0.025 MG PO TABS: 2.0000 | ORAL_TABLET | Freq: Four times a day (QID) | ORAL | 0 refills | Status: AC | PRN

## 2016-06-25 NOTE — Telephone Encounter (Signed)
Dr. Sherryll BurgerShah notified, will discuss with patient at her appointment

## 2016-06-26 ENCOUNTER — Other Ambulatory Visit: Payer: Self-pay | Admitting: Family Medicine

## 2016-06-26 DIAGNOSIS — K580 Irritable bowel syndrome with diarrhea: Secondary | ICD-10-CM

## 2016-06-30 ENCOUNTER — Ambulatory Visit: Payer: Self-pay | Admitting: Family Medicine

## 2016-12-23 ENCOUNTER — Ambulatory Visit
Admission: EM | Admit: 2016-12-23 | Discharge: 2016-12-23 | Disposition: A | Discharge status: Other Acute Inpatient Hospital | Payer: Self-pay | Attending: Family Medicine | Admitting: Family Medicine

## 2016-12-23 DIAGNOSIS — R9431 Abnormal electrocardiogram [ECG] [EKG]: Secondary | ICD-10-CM | POA: Insufficient documentation

## 2016-12-23 DIAGNOSIS — R350 Frequency of micturition: Secondary | ICD-10-CM | POA: Insufficient documentation

## 2016-12-23 DIAGNOSIS — R300 Dysuria: Secondary | ICD-10-CM

## 2016-12-23 DIAGNOSIS — R1030 Lower abdominal pain, unspecified: Secondary | ICD-10-CM

## 2016-12-23 DIAGNOSIS — R42 Dizziness and giddiness: Secondary | ICD-10-CM

## 2016-12-23 DIAGNOSIS — R410 Disorientation, unspecified: Secondary | ICD-10-CM

## 2016-12-23 DIAGNOSIS — R531 Weakness: Secondary | ICD-10-CM

## 2016-12-23 HISTORY — DX: Essential (primary) hypertension: I10

## 2016-12-23 LAB — GLUCOSE, CAPILLARY: GLUCOSE-CAPILLARY: 104 mg/dL — AB (ref 65–99)

## 2016-12-23 NOTE — ED Provider Notes (Addendum)
[facility] ____________________________________________  Time seen: Approximately 3:23 PM  I have reviewed the triage vital signs and the nursing notes.   HISTORY  Chief Complaint Urinary Frequency  HPI Megan Chung is a 61 y.o. female  past medical history of affective disorder, COPD, depression, IBS, pernicious anemia, seizures, cerebral aneurysm s/p clip 2004, HTN, presenting with son at bedside for evaluation of multiple medical complaints. Patient chief complaint is dysuria stating over the last 1.5 weeks she has had urinary frequency, urinary urgency and foul smelling urine. Patient reports the symptoms were also worsened in the last few days. Son at bedside reports patient over the last 4-5 days has been disoriented. Son states patient does have a history of similar disorientation but would not last more than 2-3 days and current episode is atypical for her. Patient states she is mildly having dysuria but also lower abdominal pain, dizziness, weakness. Son reports patient has a loss of thoughts frequently as well as frequent confusion as well as fall asleep mid sentence. Patient also reports that she currently has a mild headache in which she has daily chronic headaches and takes aspirin and ibuprofen multiple times per day on a regular basis. Last took ibuprofen and aspirin a few hours ago. Patient also reports she isn't having chest pain and shortness of breath that has been going on for a few months unchanged. Son reports when mother was having similar episodes in the past she was never evaluated during the episode. Also reports one episode of bloody stool a few weeks ago, denies any other abnormal stools or dark-colored.  Denies any acute changes in the symptoms, reports they have been stable and unchanging the last 5 days. Reports patient drinks well, very much a decreased appetite. Also states patient to extremity every day because she is complaining of dizziness every  day. Reports patient has been off of all of her medications the last 3-4 months as she had not followed up to have refills. Denies cardiac history. Denies renal insufficiency. Denies diabetes.  Has an appointment with [facility] next week to establish new primary care.   Past Medical History:  Diagnosis Date  . Anxiety   . Depression   . Edema   . GERD (gastroesophageal reflux disease)   . Headache   . Hypertension   . IBS (irritable bowel syndrome)   . Irritable bowel syndrome     Patient Active Problem List   Diagnosis Date Noted  . Headache 12/19/2015  . Hypertension goal BP (blood pressure) < 140/90 08/21/2015  . History of pernicious anemia 08/21/2015  . Chronic nausea 08/21/2015  . Affective disorder, major 12/03/2014  . Anxiety, generalized 12/03/2014  . COPD, moderate (HCC) 12/03/2014  . Clinical depression 12/03/2014  . Accumulation of fluid in tissues 12/03/2014  . Cephalalgia 12/03/2014  . Adaptive colitis 12/03/2014  . Cannot sleep 12/03/2014  . Addiction to drug (HCC) 12/03/2014  . 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 Esophagitis, reflux 12/03/2014  . Seizure (HCC) 12/03/2014  . Compulsive tobacco user syndrome 12/03/2014  . IBS (irritable bowel syndrome) 12/03/2014    Past Surgical History:  Procedure Laterality Date  . ARTERIAL ANEURYSM REPAIR  2004   unspecified type  . BRAIN SURGERY    . GALLBLADDER SURGERY    . TONSILLECTOMY    . ulcer surgery  2000     No current facility-administered medications for this encounter.   Current Outpatient Prescriptions:  .  albuterol (PROAIR HFA) 108 (90 Base) MCG/ACT inhaler,  Inhale into the lungs., Disp: , Rfl:  .  diphenoxylate-atropine (LOMOTIL) 2.5-0.025 MG tablet, Take 2 tablets by mouth 4 (four) times daily as needed for diarrhea or loose stools., Disp: 120 tablet, Rfl: 0 .  hydrochlorothiazide (HYDRODIURIL) 25 MG tablet, Take 1 tablet (25 mg total) by mouth daily., Disp: 90 tablet, Rfl: 0 .  omeprazole  (PRILOSEC) 40 MG capsule, Take 1 capsule (40 mg total) by mouth daily., Disp: 90 capsule, Rfl: 0 .  promethazine (PHENERGAN) 25 MG tablet, TAKE 1 TABLET BY MOUTH EVERY 8 HOURS AS NEEDED FOR NAUSEA & VOMITING, Disp: 90 tablet, Rfl: 1 .  sertraline (ZOLOFT) 100 MG tablet, Take 1 tablet (100 mg total) by mouth 2 (two) times daily., Disp: 180 tablet, Rfl: 0 .  topiramate (TOPAMAX) 100 MG tablet, Take 1.5 tablets (150 mg total) by mouth 2 (two) times daily., Disp: 90 tablet, Rfl: 0 .  triamcinolone (KENALOG) 0.025 % cream, TRIAMCINOLONE ACETONIDE, 0.025% (External Cream)  apply to affected area Cream three times daily for 30 days  Quantity: 0;  Refills: 3   Ordered :18-Jul-2014  Dennison Mascot MD;  Started 18-Jul-2014 Active, Disp: , Rfl:   Allergies Penicillins  Family History  Problem Relation Age of Onset  . Depression Mother   Mother : UTI with confusion Grandmother: HTN  Social History Social History  Substance Use Topics  . Smoking status: Current Every Day Smoker    Packs/day: 1.00    Years: 20.00    Types: Cigarettes  . Smokeless tobacco: Current User  . Alcohol use No    Review of Systems Constitutional: No fever/chills Eyes: No visual changes. ENT: No sore throat. Cardiovascular: Denies chest pain. Respiratory: Denies shortness of breath. Gastrointestinal:As above.  Genitourinary: Positive for dysuria. Musculoskeletal: States some low back pain. Skin: Negative for rash. Neurological: Negative for focal weakness or numbness. As above.   ____________________________________________   PHYSICAL EXAM:  VITAL SIGNS: ED Triage Vitals  Enc Vitals Group     BP 12/23/16 1456 (!) 126/55     Pulse Rate 12/23/16 1456 87     Resp 12/23/16 1456 16     Temp 12/23/16 1456 98.2 F (36.8 C)     Temp Source 12/23/16 1456 Oral     SpO2 12/23/16 1456 99 %     Weight 12/23/16 1502 152 lb 3.2 oz (69 kg)     Height 12/23/16 1502 5' (1.524 m)     Head Circumference --      Peak  Flow --      Pain Score --      Pain Loc --      Pain Edu? --      Excl. in [place]? --     Constitutional: Alert and oriented. Well appearing. Pale coloration. Eyes: Conjunctivae are normal.  ENT      Head: Normocephalic and atraumatic.      Nose: No congestion/rhinnorhea.      Mouth/Throat: Mucous membranes are moist.Oropharynx non-erythematous. Cardiovascular: Normal rate, regular rhythm. Grossly normal heart sounds.  Good peripheral circulation. Respiratory: Normal respiratory effort without tachypnea nor retractions. Breath sounds are clear and equal bilaterally. No wheezes, rales, rhonchi. Gastrointestinal: Mild suprapubic tenderness. Abdomen otherwise soft and nontender.  Musculoskeletal:   Neurologic:  Normal speech and language. No gross focal neurologic deficits are appreciated. Speech is normal. Sensation intact to face, upper and lower extremities bilaterally. Bilateral hand grips equal. Ambulatory with mild antalgic gait. Skin:  Skin is warm, dry. Psychiatric: Mood and affect are normal.  Speech and behavior are normal. Patient exhibits appropriate insight and judgment   ___________________________________________   LABS (all labs ordered are listed, but only abnormal results are displayed)  Labs Reviewed  GLUCOSE, CAPILLARY - Abnormal; Notable for the following:       Result Value   Glucose-Capillary 104 (*)    All other components within normal limits   ____________________________________________  EKG  ED ECG REPORT I, Renford DillsLindsey Jaqwon Manfred, the attending provider, personally viewed and interpreted this ECG.   Date: 12/23/2016  EKG Time: 1559  Rate: 83  Rhythm: normal sinus rhythm,   Intervals:prolonged QT  ST&T Change: no ST or T wave elevation or depression noted.  ____________________________________________   PROCEDURES Procedures    INITIAL IMPRESSION / ASSESSMENT AND PLAN / ED COURSE  Pertinent labs & imaging results that were available during my care  of the patient were reviewed by me and considered in my medical decision making (see chart for details).  Patient with son at bedside with intermittent disorientation with difficulty completing current thoughts as well as noted generalized weakness. No focal weakness or neurological deficit noted. No acute changes per patient and son. Blood sugar 104. EKG is unremarkable. Discussed recommend further evaluation emergency room at this time for these multiple complaints. Patient appears stable. Patient declines EMS transfer. Patient and son state that [facility]. Geoffery SpruceLeann RN triage nurse called and given report by myself. Patient stable at the time of discharge.  ____________________________________________   FINAL CLINICAL IMPRESSION(S) / ED DIAGNOSES  Final diagnoses:  Weakness  Dysuria  Disorientation  Lower abdominal pain     Discharge Medication List as of 12/23/2016  4:25 PM      Note: This dictation was prepared with Dragon dictation along with smaller phrase technology. Any transcriptional errors that result from this process are unintentional.         Renford DillsMiller, Dorsel Flinn, NP 12/23/16 1725    Renford DillsMiller, Nolin Grell, NP 12/23/16 1725

## 2016-12-23 NOTE — ED Triage Notes (Signed)
61 year old Caucasian female is here today with her son with complaints of polyuria, abdomen pain, dizziness, disoriented, slurring speech since Friday. She states she does have a history of UTIs but has not seen a doctor. Her son states she has an appointment with her new PCP on this coming Monday.

## 2019-11-27 ENCOUNTER — Emergency Department
Admission: EM | Admit: 2019-11-27 | Discharge: 2019-11-28 | Disposition: A | Discharge status: Other Acute Inpatient Hospital | Payer: Self-pay | Attending: Emergency Medicine | Admitting: Emergency Medicine

## 2019-11-27 ENCOUNTER — Emergency Department: Payer: Self-pay

## 2019-11-27 ENCOUNTER — Other Ambulatory Visit: Payer: Self-pay

## 2019-11-27 DIAGNOSIS — S51831A Puncture wound without foreign body of right forearm, initial encounter: Secondary | ICD-10-CM | POA: Insufficient documentation

## 2019-11-27 DIAGNOSIS — Y93K9 Activity, other involving animal care: Secondary | ICD-10-CM | POA: Insufficient documentation

## 2019-11-27 DIAGNOSIS — Z20822 Contact with and (suspected) exposure to covid-19: Secondary | ICD-10-CM | POA: Insufficient documentation

## 2019-11-27 DIAGNOSIS — W540XXA Bitten by dog, initial encounter: Secondary | ICD-10-CM | POA: Insufficient documentation

## 2019-11-27 DIAGNOSIS — Z23 Encounter for immunization: Secondary | ICD-10-CM | POA: Insufficient documentation

## 2019-11-27 DIAGNOSIS — S01551A Open bite of lip, initial encounter: Secondary | ICD-10-CM | POA: Insufficient documentation

## 2019-11-27 DIAGNOSIS — I10 Essential (primary) hypertension: Secondary | ICD-10-CM | POA: Insufficient documentation

## 2019-11-27 DIAGNOSIS — Y929 Unspecified place or not applicable: Secondary | ICD-10-CM | POA: Insufficient documentation

## 2019-11-27 DIAGNOSIS — S0185XA Open bite of other part of head, initial encounter: Secondary | ICD-10-CM

## 2019-11-27 DIAGNOSIS — Y999 Unspecified external cause status: Secondary | ICD-10-CM | POA: Insufficient documentation

## 2019-11-27 LAB — SARS CORONAVIRUS 2 BY RT PCR (HOSPITAL ORDER, PERFORMED IN ~~LOC~~ HOSPITAL LAB): SARS Coronavirus 2: NEGATIVE

## 2019-11-27 MED ORDER — FENTANYL CITRATE (PF) 100 MCG/2ML IJ SOLN
25.0000 ug | Freq: Once | INTRAMUSCULAR | Status: AC
  Administered 2019-11-27: 25 ug via INTRAVENOUS
  Filled 2019-11-27: qty 2

## 2019-11-27 MED ORDER — FENTANYL CITRATE (PF) 100 MCG/2ML IJ SOLN
INTRAMUSCULAR | Status: AC
  Administered 2019-11-27: 25 ug
  Filled 2019-11-27: qty 2

## 2019-11-27 MED ORDER — AMOXICILLIN-POT CLAVULANATE 400-57 MG/5ML PO SUSR
800.0000 mg | ORAL | Status: AC
  Administered 2019-11-27: 800 mg via ORAL
  Filled 2019-11-27: qty 10

## 2019-11-27 MED ORDER — FENTANYL CITRATE (PF) 100 MCG/2ML IJ SOLN: 25.0000 ug | Freq: Once | INTRAMUSCULAR | Status: AC

## 2019-11-27 MED ORDER — AMOXICILLIN-POT CLAVULANATE 875-125 MG PO TABS: 1.0000 | ORAL_TABLET | Freq: Once | ORAL | Status: DC

## 2019-11-27 MED ORDER — TETANUS-DIPHTH-ACELL PERTUSSIS 5-2.5-18.5 LF-MCG/0.5 IM SUSP
0.5000 mL | Freq: Once | INTRAMUSCULAR | Status: AC
  Administered 2019-11-27: 0.5 mL via INTRAMUSCULAR
  Filled 2019-11-27: qty 0.5

## 2019-11-27 MED ORDER — HYDROMORPHONE HCL 1 MG/ML IJ SOLN
0.5000 mg | INTRAMUSCULAR | Status: AC
  Administered 2019-11-27: 0.5 mg via INTRAVENOUS
  Filled 2019-11-27: qty 1

## 2019-11-27 NOTE — ED Triage Notes (Signed)
Pt arrives from home via [facility] following being bit by neighbors dog. Pt was bit in upper lip and on right forearm. Pt's bleeding controlled by EMS.

## 2019-11-27 NOTE — ED Notes (Signed)
Spoke w/ Vivi Barrack from [facility] sheriff's dept and he stated it is up to the pt to call for a report to animal control. Pt's husband reported that the sheriff's department came to the house and the neighbor's dog is currently under quarantine.

## 2019-11-27 NOTE — ED Provider Notes (Signed)
[facility] Emergency Department Provider Note   ____________________________________________   First MD Initiated Contact with Patient 11/27/19 1914     (approximate)  I have reviewed the triage vital signs and the nursing notes.   HISTORY  Chief Complaint Animal Bite    HPI Megan Chung is a 64 y.o. female here for evaluation after he bit by a neighbor's dog  Patient reports she was feeding neighbors dog when it bit her across her upper lip and right forearm.   Occurred just prior to arrival.  Patient was bit across her upper lip and also in her right forearm.  She did not fall or become injured.  Reports the nausea dog is her neighbors, has been least at her neighbors for about 7 years.  Reports the neighbor told her the dog had not been up-to-date on rabies at this point.  Patient reports the dog is not exhibited any unusual symptoms and was acting normal prior to being bit while she was feeding  No fevers chills or recent illness.  Denies any Covid exposure Covid-like symptoms.  Reports a mild headache feels like goes across her lip toward the front of her face.  Past Medical History:  Diagnosis Date  . Anxiety   . Depression   . Edema   . GERD (gastroesophageal reflux disease)   . Headache   . Hypertension   . IBS (irritable bowel syndrome)   . Irritable bowel syndrome     Patient Active Problem List   Diagnosis Date Noted  . Headache 12/19/2015  . Hypertension goal BP (blood pressure) < 140/90 08/21/2015  . History of pernicious anemia 08/21/2015  . Chronic nausea 08/21/2015  . Affective disorder, major 12/03/2014  . Anxiety, generalized 12/03/2014  . COPD, moderate (HCC) 12/03/2014  . Clinical depression 12/03/2014  . Accumulation of fluid in tissues 12/03/2014  . Cephalalgia 12/03/2014  . Adaptive colitis 12/03/2014  . Cannot sleep 12/03/2014  . Addiction to drug (HCC) 12/03/2014  . 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  Esophagitis, reflux 12/03/2014  . Seizure (HCC) 12/03/2014  . Compulsive tobacco user syndrome 12/03/2014  . IBS (irritable bowel syndrome) 12/03/2014    Past Surgical History:  Procedure Laterality Date  . ARTERIAL ANEURYSM REPAIR  2004   unspecified type  . BRAIN SURGERY    . GALLBLADDER SURGERY    . TONSILLECTOMY    . ulcer surgery  2000    Prior to Admission medications   Medication Sig Start Date End Date Taking? Authorizing Provider  albuterol (PROAIR HFA) 108 (90 Base) MCG/ACT inhaler Inhale into the lungs. 12/05/15   [provider]  diphenoxylate-atropine (LOMOTIL) 2.5-0.025 MG tablet Take 2 tablets by mouth 4 (four) times daily as needed for diarrhea or loose stools. 06/25/16   Ellyn Hack, MD  hydrochlorothiazide (HYDRODIURIL) 25 MG tablet Take 1 tablet (25 mg total) by mouth daily. 01/22/16   Ellyn Hack, MD  omeprazole (PRILOSEC) 40 MG capsule Take 1 capsule (40 mg total) by mouth daily. 01/22/16   Ellyn Hack, MD  promethazine (PHENERGAN) 25 MG tablet TAKE 1 TABLET BY MOUTH EVERY 8 HOURS AS NEEDED FOR NAUSEA & VOMITING 03/25/16   Ellyn Hack, MD  sertraline (ZOLOFT) 100 MG tablet Take 1 tablet (100 mg total) by mouth 2 (two) times daily. 01/22/16   Ellyn Hack, MD  topiramate (TOPAMAX) 100 MG tablet Take 1.5 tablets (150 mg total) by mouth 2 (two) times  daily. 01/22/16   Roselee Nova, MD  triamcinolone (KENALOG) 0.025 % cream TRIAMCINOLONE ACETONIDE, 0.025% (External Cream)  apply to affected area Cream three times daily for 30 days  Quantity: 0;  Refills: 3   Ordered :18-Jul-2014  Ashok Norris MD;  Started 18-Jul-2014 Active 07/18/14   [provider]    Allergies Penicillins  Family History  Problem Relation Age of Onset  . Depression Mother     Social History Social History   Tobacco Use  . Smoking status: Current Every Day Smoker    Packs/day: 1.00    Years: 20.00    Pack years: 20.00    Types: Cigarettes    . Smokeless tobacco: Current User  Substance Use Topics  . Alcohol use: No    Alcohol/week: 0.0 standard drinks  . Drug use: No    Review of Systems Constitutional: No fever/chills Eyes: No visual changes. ENT: No sore throat.  No neck pain. Cardiovascular: Denies chest pain. Respiratory: Denies shortness of breath. Musculoskeletal: Negative for back pain.  Some pain in her right forearm after being bit minimal. Skin: Negative for rash. Neurological: Negative forareas of focal weakness or numbness.    ____________________________________________   PHYSICAL EXAM:  VITAL SIGNS: ED Triage Vitals  Enc Vitals Group     BP 11/27/19 1907 123/88     Pulse Rate 11/27/19 1907 92     Resp 11/27/19 1907 18     Temp --      Temp src --      SpO2 11/27/19 1907 97 %     Weight 11/27/19 1910 142 lb 3.2 oz (64.5 kg)     Height 11/27/19 1910 5' (1.524 m)     Head Circumference --      Peak Flow --      Pain Score 11/27/19 1908 10     Pain Loc --      Pain Edu? --      Excl. in [place]? --     Constitutional: Alert and oriented. Well appearing and in no acute distress. Eyes: Conjunctivae are normal. Head: Atraumatic except for noted significant avulsion of the skin from about the philtrum to her vermilion border the upper lip bilateral, there is notable amount of skin missing, there is also a small probable intraoral lesion along the inferior border of the upper vermilion inner buccal mucosa.  Lower lip appears atraumatic of note, there is significant tissue loss which I suspect would make it extremely difficult to reasonably repair by myself at this point. Nose: No congestion/rhinnorhea. Mouth/Throat: Mucous membranes are moist. Neck: No stridor.  Cardiovascular: Normal rate, regular rhythm. Grossly normal heart sounds.  Good peripheral circulation. Respiratory: Normal respiratory effort.  No retractions. Lungs CTAB. Gastrointestinal: Soft and nontender. No distention. Musculoskeletal:  No lower extremity tenderness nor edema.  No noted deformities.  A single puncture wound is noted to the mid right forearm no bleeding. Neurologic:  Normal speech and language. No gross focal neurologic deficits are appreciated.  Skin:  Skin is warm, dry and intact. No rash noted. Psychiatric: Mood and affect are normal. Speech and behavior are normal.  ____________________________________________   LABS (all labs ordered are listed, but only abnormal results are displayed)  Labs Reviewed  SARS CORONAVIRUS 2 BY RT PCR (HOSPITAL ORDER, [place] [facility])   ____________________________________________  EKG   ____________________________________________  RADIOLOGY  DG Forearm Right  Result Date: 11/27/2019 CLINICAL DATA:  Dog bite to the right forearm. EXAM:  RIGHT FOREARM - 2 VIEW COMPARISON:  None. FINDINGS: Cortical margins of the radius and ulna are intact. There is no evidence of fracture or other focal bone lesions. Wrist and elbow alignment are maintained. Mild soft tissue edema. No tracking soft tissue air or radiopaque foreign body. IMPRESSION: Mild soft tissue edema. No radiopaque foreign body or osseous abnormality. Electronically Signed   By: Narda Rutherford M.D.   On: 11/27/2019 19:55    Imaging reviewed negative for fracture or foreign body ____________________________________________   PROCEDURES  Procedure(s) performed: None  Procedures  Critical Care performed: No  ____________________________________________   INITIAL IMPRESSION / ASSESSMENT AND PLAN / ED COURSE  Pertinent labs & imaging results that were available during my care of the patient were reviewed by me and considered in my medical decision making (see chart for details).   Bite wound, primary concern across face but also puncture right forearm.  Tetanus updated, Augmentin given.  Dog confirmed to be quarantined by [facility] department with animal services quarantining.  No  obvious high risk noted for rabies  Clinical Course as of Nov 27 2318  Mon Nov 27, 2019  1916 Patient reports she can take Augmentin and amoxicillin without issue, only allergic to specifically penicillin as a child which caused a rash   [MQ]  1917 Nurse to contact animal control regarding possible quarantine of the animal and need for postexposure rabies prophylaxis.  Patient did give consent to contact animal control to discuss.  She reports this was a neighbors dog, dog has been acting behaving normally, she does report however the neighbor reports that has not received the rabies vaccine in some time.  Patient very apprehensive about receiving rabies treatments right now, and does not seem from her description that the dog would necessarily be rabid.  Contacting animal control for further, possibly dog could be quarantine   [MQ]  1930 And examination of the patient's upper lip, she certainly has significant loss of tissue including skin layers overlying the upper lip.  I believe this would benefit from evaluation of the plastic surgeon   [MQ]  2012 Discussed transfer with plastic surgeon at [facility], they would be happy to evaluate the patient except hospital is at full capacity unable to accept the patient due to max capacity at this time, confirmed with the ER physician and transfer center.  Discussed further with the patient, she and her husband would next prefer [facility] and if not there then [facility]   [MQ]  2021 Awaiting callback from specialist at [facility]   [MQ]  2247 Patient reassessed, pain recurring.  Will try Dilaudid for longer lasting relief.  She is fully awake and alert, agreeable.  Issue wound bandaged with Vaseline dressing over her upper lip which patient tolerated well.  Currently awaiting EMS unit to transfer to [facility].  Patient remained stable for transfer   [MQ]    Clinical Course User Index [MQ] Sharyn Creamer, MD   Patient accepted to [facility]  by Dr. Karle Plumber for further care and management and consultation with facial plastic specialist availability.  Patient accepted with capabilities available per [facility] transfer center  ----------------------------------------- 9:48 PM on 11/27/2019 -----------------------------------------  Patient reassessed, patient and husband both understanding comfortable with plan for transfer.  ----------------------------------------- 11:20 PM on 11/27/2019 -----------------------------------------  Ongoing care assigned to Dr. Don Perking, patient awaiting transfer to [facility] for plastic surgery/facial surgery evaluation for dog bite wounds.  Currently stable, awaiting transfer via EMS  ____________________________________________  FINAL CLINICAL IMPRESSION(S) / ED DIAGNOSES  Final diagnoses:  Dog bite of face, initial encounter  Puncture wound of right forearm, initial encounter        Note:  This document was prepared using Dragon voice recognition software and may include unintentional dictation errors       Sharyn Creamer, MD 11/27/19 2321

## 2019-11-27 NOTE — ED Notes (Signed)
[facility] here to transport pt to [facility]

## 2021-02-07 IMAGING — DX DG FOREARM 2V*R*
2 series · 2 of 2 positions shown · non-contrast
Comparison: None.

CLINICAL DATA: Dog bite to the right forearm.

EXAM:
RIGHT FOREARM - 2 VIEW

[forearm ap]
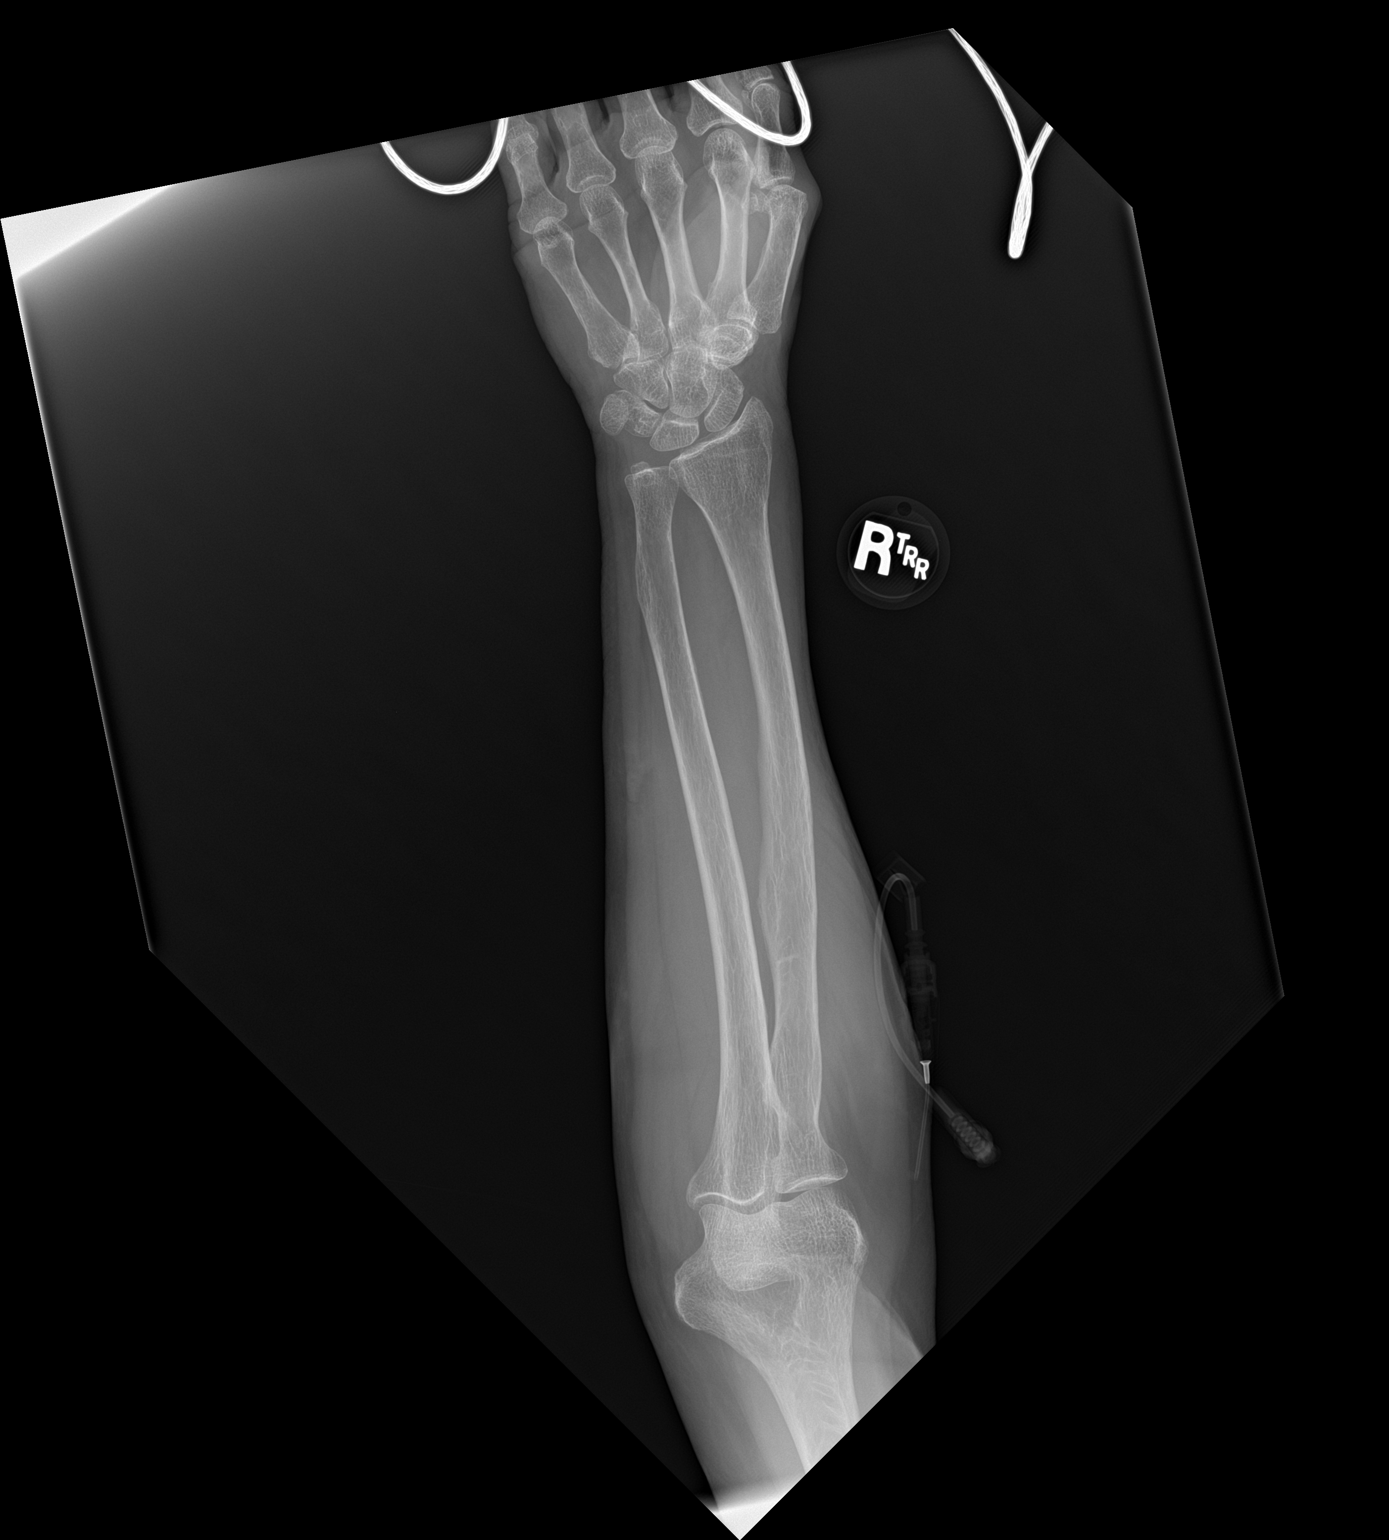

[forearm lat]
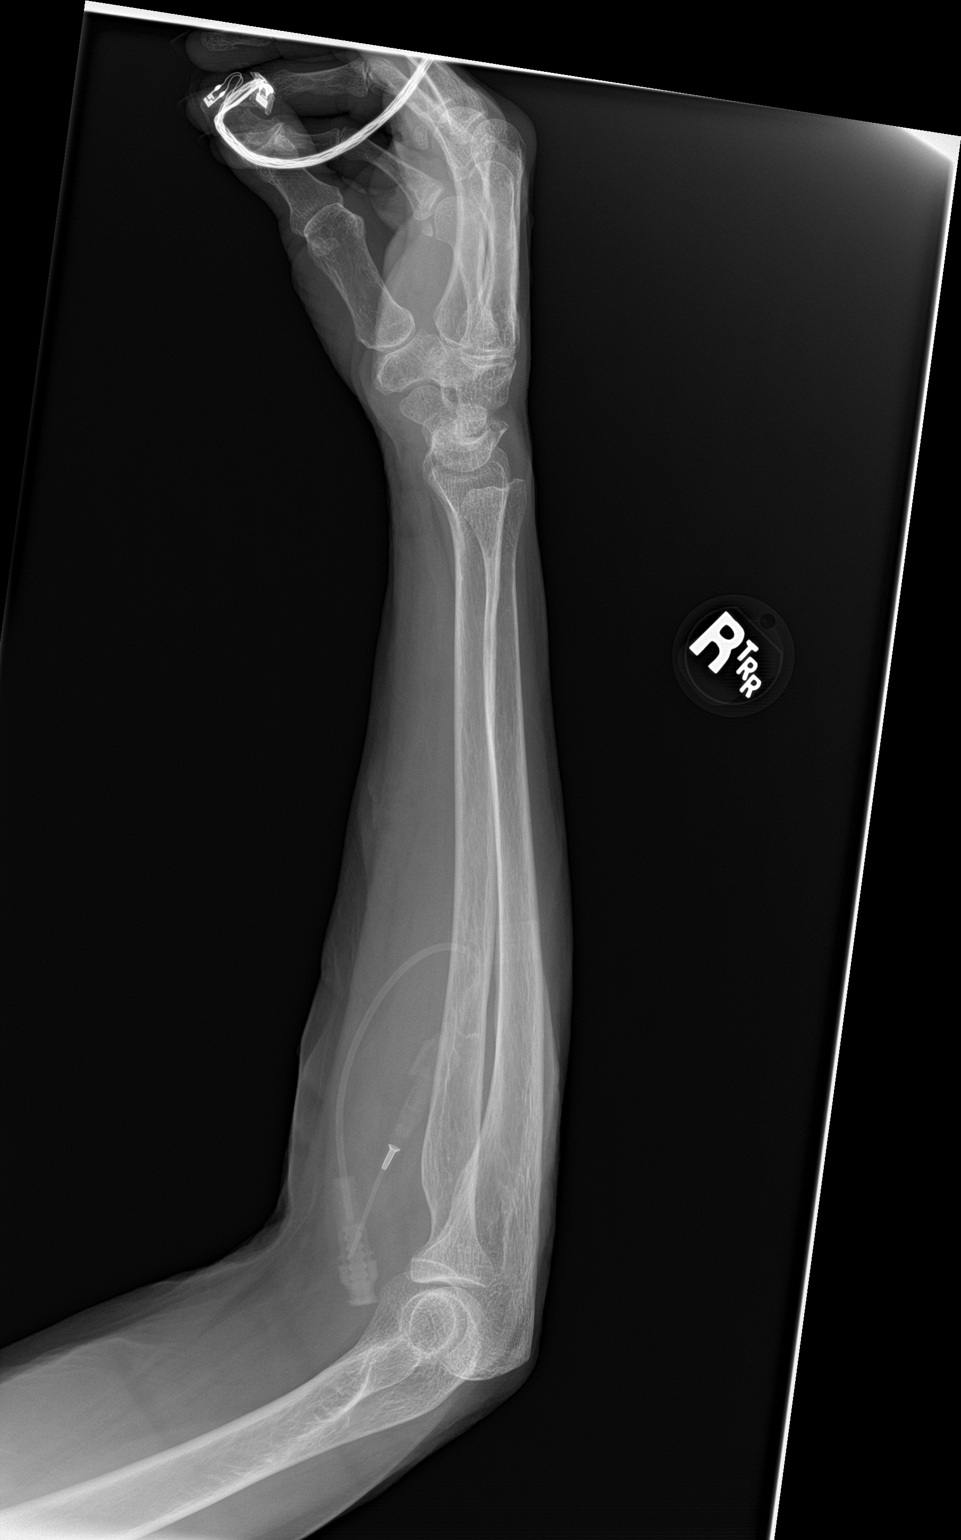

[2 of 2 positions shown; findings below may reference images not displayed]

FINDINGS: Cortical margins of the radius and ulna are intact. There is no
evidence of fracture or other focal bone lesions. Wrist and elbow
alignment are maintained. Mild soft tissue edema. No tracking soft
tissue air or radiopaque foreign body.
IMPRESSION: Mild soft tissue edema. No radiopaque foreign body or osseous
abnormality.
